# Patient Record
Sex: Female | Born: 1955 | Race: White | Marital: Married | Smoking: Never smoker
Health system: Southern US, Community
[De-identification: ages and names within clinical notes are randomized; demographics above are authoritative.]

## PROBLEM LIST (undated history)

## (undated) DIAGNOSIS — E7800 Pure hypercholesterolemia, unspecified: Secondary | ICD-10-CM

## (undated) DIAGNOSIS — I10 Essential (primary) hypertension: Secondary | ICD-10-CM

## (undated) DIAGNOSIS — M1990 Unspecified osteoarthritis, unspecified site: Secondary | ICD-10-CM

## (undated) DIAGNOSIS — E119 Type 2 diabetes mellitus without complications: Secondary | ICD-10-CM

## (undated) HISTORY — DX: Pure hypercholesterolemia, unspecified: E78.00

## (undated) HISTORY — DX: Essential (primary) hypertension: I10

## (undated) HISTORY — DX: Unspecified osteoarthritis, unspecified site: M19.90

## (undated) HISTORY — DX: Type 2 diabetes mellitus without complications: E11.9

## (undated) HISTORY — PX: HAND SURGERY: SHX662

---

## 2010-09-11 ENCOUNTER — Ambulatory Visit (HOSPITAL_COMMUNITY)
Admission: RE | Admit: 2010-09-11 | Discharge: 2010-09-11 | Disposition: A | Discharge status: Home / Self Care | Payer: BC Managed Care – PPO | Source: Ambulatory Visit | Attending: Rheumatology | Admitting: Rheumatology

## 2010-09-11 ENCOUNTER — Other Ambulatory Visit (HOSPITAL_COMMUNITY): Payer: Self-pay | Admitting: Rheumatology

## 2010-09-11 DIAGNOSIS — D869 Sarcoidosis, unspecified: Secondary | ICD-10-CM

## 2012-01-04 IMAGING — CR DG CHEST 2V
2 series · 2 of 2 positions shown · non-contrast
Comparison: None.

CLINICAL DATA: Sarcoidosis

CHEST - 2 VIEW

[w chest pa]
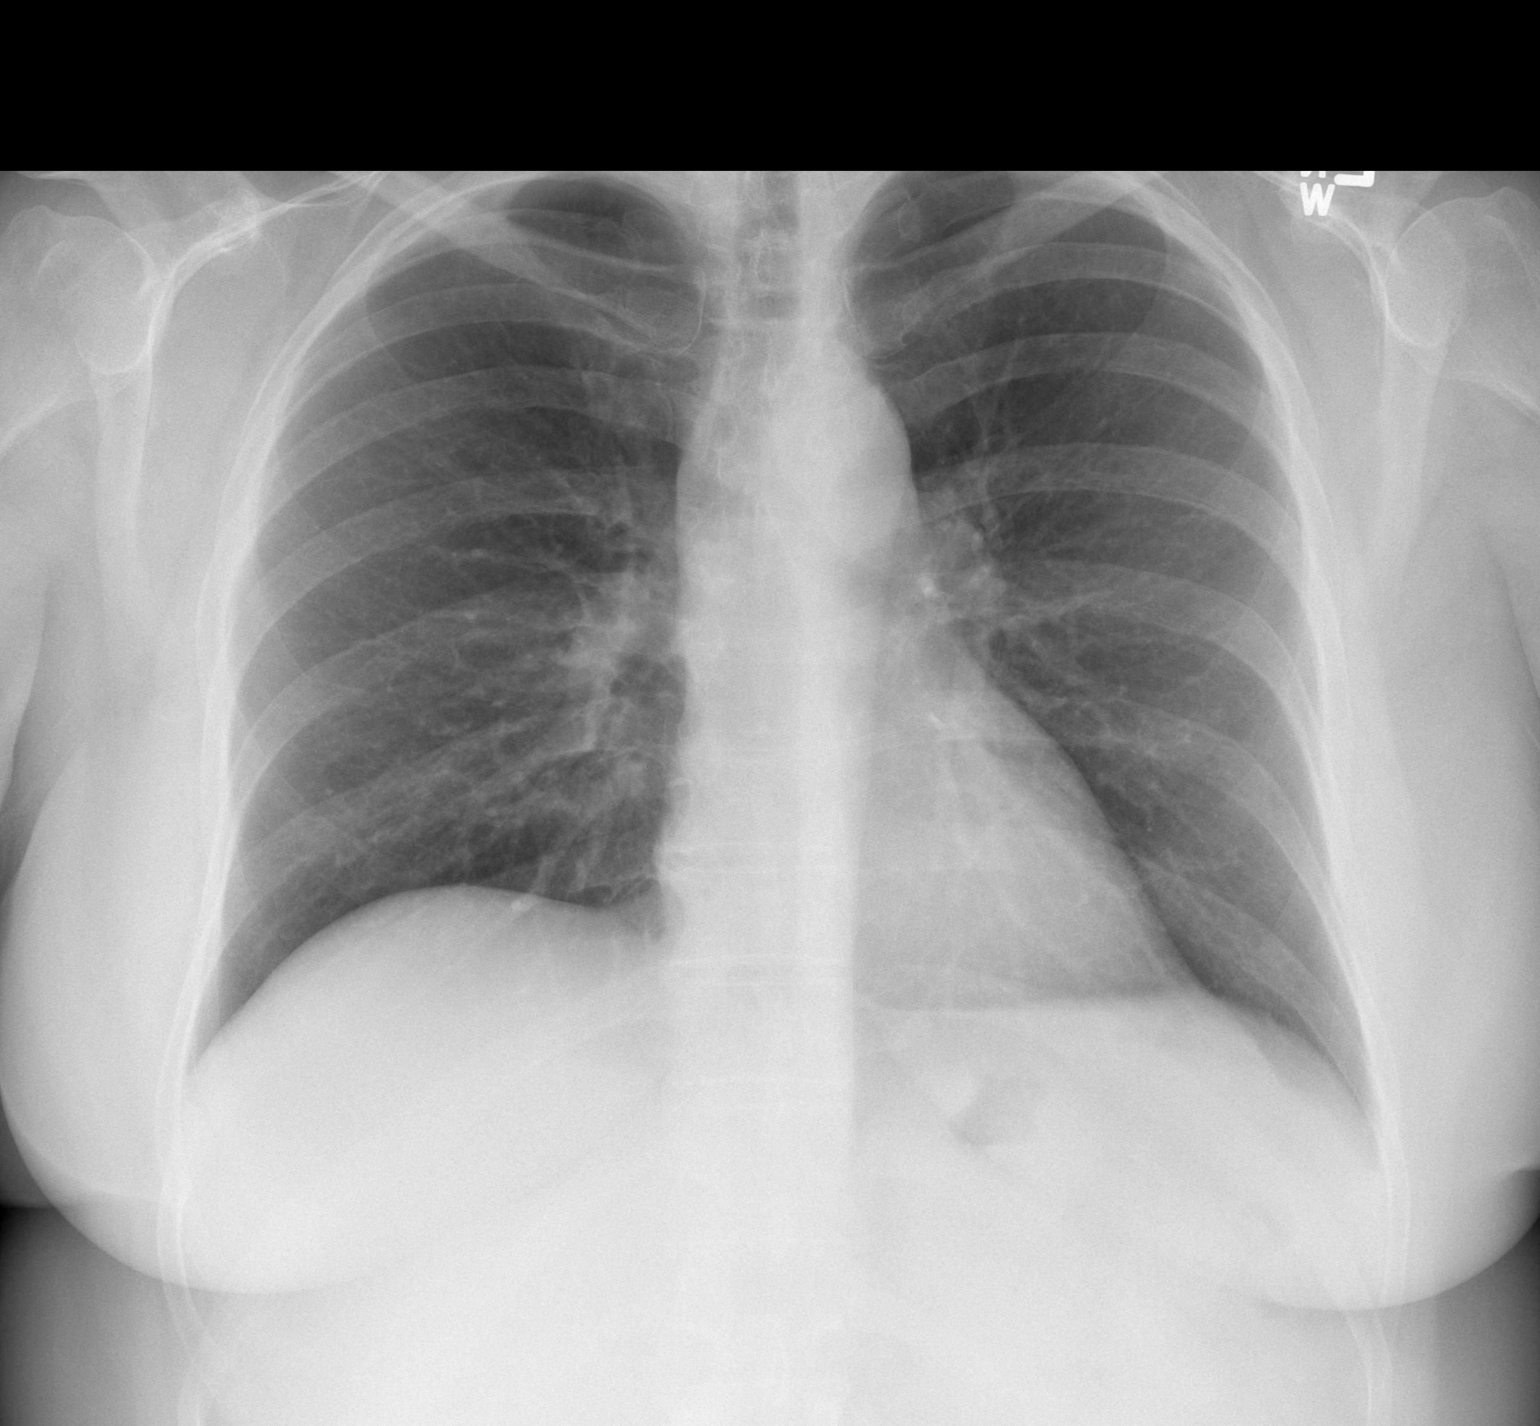

[w chest lat]
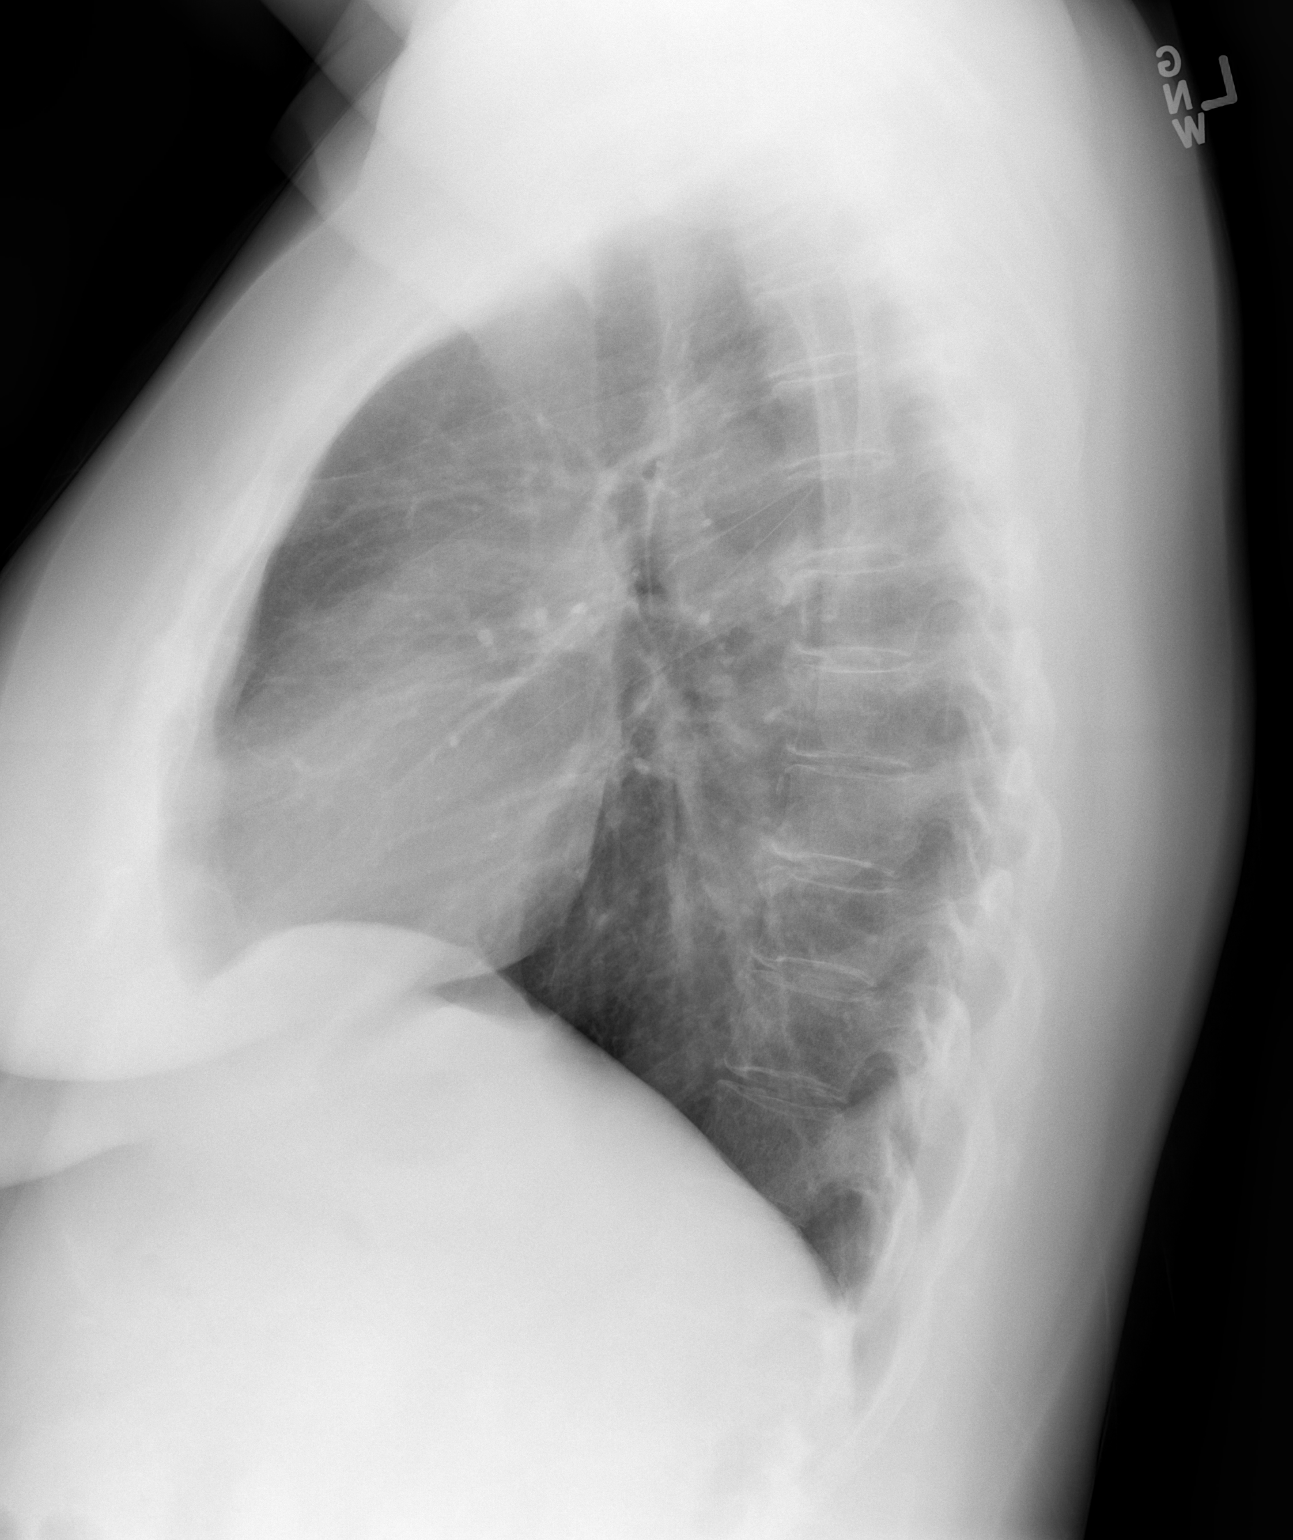

[2 of 2 positions shown; findings below may reference images not displayed]

FINDINGS: Heart is normal in size.  Lungs are clear.  No
pneumothorax and no pleural effusion.
IMPRESSION: No active cardiopulmonary disease.

## 2016-02-20 DIAGNOSIS — L4050 Arthropathic psoriasis, unspecified: Secondary | ICD-10-CM | POA: Insufficient documentation

## 2016-02-20 DIAGNOSIS — I10 Essential (primary) hypertension: Secondary | ICD-10-CM | POA: Insufficient documentation

## 2016-02-20 DIAGNOSIS — E7800 Pure hypercholesterolemia, unspecified: Secondary | ICD-10-CM | POA: Insufficient documentation

## 2016-02-20 DIAGNOSIS — K760 Fatty (change of) liver, not elsewhere classified: Secondary | ICD-10-CM | POA: Insufficient documentation

## 2016-02-20 DIAGNOSIS — E119 Type 2 diabetes mellitus without complications: Secondary | ICD-10-CM | POA: Insufficient documentation

## 2016-02-20 NOTE — Progress Notes (Addendum)
*IMAGE* Office Visit Note  Patient: Gail Hart             Date of Birth: 08/29/1955           MRN: [id number]             PCP: Ninetta LightsFURR,SARA, MD Referring: No ref. provider found Visit Date: 02/21/2016    Subjective:  Follow-up on psoriasis , Psoriatic arthritis, and high risk prescription.   History of Present Illness: Gail RuaJanet Roen is a 60 y.o. female  See above. Last seen in our office on 08/22/2015. Patient is currently on Humira every 6 weeks according to the last visit.   She also has a history of fatty liver disease. AST was 49 on January 2017. It was 73. A LT was 85 on 05/16/2015. It was 130.  Patient's AST is now 33 on 01/15/2016 labs A.l. T is 50 on the same labs. Patient has been following the next 56 day diet and successfully lost pounds between the last visit and this visit. And her liver numbers have also improved as a result of this diet.  Patient is complaining of the left knee has been bothering her for the last 2 months. She describes a burning sensation to the lateral aspect of her left knee. She does not describe stiffness. She is able to ambulate well otherwise. No swelling or warmth.  Approximately 5 years ago, patient injured her left knee while doing Zumba. She followed up with Dr. Debbe Odeaall Dorf and [facility]. An MRI proved that she had tear of her meniscus but it was not surgically repaired.  Activities of Daily Living:  Patient reports morning stiffness for 15 minutes.   Patient Denies nocturnal pain.  Difficulty dressing/grooming: Denies Difficulty climbing stairs: Denies Difficulty getting out of chair: Denies Difficulty using hands for taps, buttons, cutlery, and/or writing: Denies   Review of Systems  Constitutional: Negative for fatigue and weight loss (intentional loss w/ next 56 day diet. Was 204; now 181 lbs).  HENT: Negative for mouth sores and mouth dryness.   Eyes: Negative for dryness.  Respiratory: Negative for shortness  of breath.   Gastrointestinal: Negative for constipation and diarrhea.  Musculoskeletal: Negative for myalgias and myalgias.  Skin: Negative for sensitivity to sunlight.  Psychiatric/Behavioral: Negative for decreased concentration and sleep disturbance.    PMFS History:  Patient Active Problem List   Diagnosis Date Noted  . Psoriatic arthropathy (HCC) 02/20/2016  . Essential hypertension 02/20/2016  . Type 2 diabetes mellitus without complication, without long-term current use of insulin (HCC) 02/20/2016  . Hypercholesterolemia 02/20/2016  . Fatty liver disease, nonalcoholic 02/20/2016    Past Medical History:  Diagnosis Date  . Arthritis    PSORIATIC  . Diabetes mellitus without complication (HCC)   . High cholesterol   . Hypertension     Family History  Problem Relation Age of Onset  . Heart disease Mother   . Hypertension Mother   . High Cholesterol Mother   . Hypertension Father   . High Cholesterol Father    Past Surgical History:  Procedure Laterality Date  . CESAREAN SECTION    . HAND SURGERY     Social History   Social History Narrative  . No narrative on file     Objective: Vital Signs: BP 139/82 (BP Location: Left Arm, Patient Position: Sitting, Cuff Size: Large)   Pulse (!) 111   Resp 14   Ht 5\' 3"  (1.6 m)   Wt 181 lb (82.1 kg)  BMI 32.06 kg/m    Physical Exam  Constitutional: She is oriented to person, place, and time. She appears well-developed and well-nourished.  HENT:  Head: Normocephalic and atraumatic.  Eyes: EOM are normal. Pupils are equal, round, and reactive to light.  Cardiovascular: Normal rate, regular rhythm and normal heart sounds.  Exam reveals no gallop and no friction rub.   No murmur heard. Pulmonary/Chest: Effort normal and breath sounds normal. She has no wheezes. She has no rales.  Abdominal: Soft. Bowel sounds are normal. She exhibits no distension. There is no tenderness. There is no guarding. No hernia.   Musculoskeletal: Normal range of motion. She exhibits no edema, tenderness or deformity.  Lymphadenopathy:    She has no cervical adenopathy.  Neurological: She is alert and oriented to person, place, and time. Coordination normal.  Skin: Skin is warm and dry. Capillary refill takes less than 2 seconds. No rash noted.  Psychiatric: She has a normal mood and affect. Her behavior is normal.  Nursing note and vitals reviewed.    Musculoskeletal Exam:  Full range of motion of all joints  Grip strength is equal and strong bilaterally Fibromyalgia tender points are all absent  CDAI Exam: CDAI Homunculus Exam:   Joint Counts:  CDAI Tender Joint count: 0 CDAI Swollen Joint count: 0     Investigation: Findings:  Labs reviewed from 01/04/2016 CMP with GFR is normal except slightly low chloride at 96 Glucose at 101 which is close to normal limits Creatinine is slightly elevated at 1.20 which we will monitor AST is mildly elevated at 49 which we will monitor Calcium is 10.5 which is close to normal limits GFR is slightly low at 50 which we will monitor  CBC with differential is within normal limits    Imaging: Xr Hand 2 View Left  Result Date: 02/23/2016 Left hand shows severe narrowing at the DIP joints of second third fourth and fifth digit. No erosions There is joint space narrowing of the PIP joints of all digits All MCP joints shows mild narrowing. Intracarpal joints spacing are well preserved. Mild radiocarpal joint space narrowing. No erosions noted to the left hand. The ulnar styloid is normal.  Xr Hand 2 View Right  Result Date: 02/23/2016 Right hand shows severe narrowing at the DIP joints of second third fourth and fifth digit. No erosions There is joint space narrowing of the PIP joints of all digits All MCP joints shows mild narrowing. Intracarpal joints spacing are well preserved. Mild radiocarpal joint space narrowing. No erosions noted to the left hand. The ulnar  styloid is normal.   Xr Knee 3 View Left  Result Date: 02/23/2016 Severe-moderate medial compartment narrowing And moderate lateral compartment narrowing No CPPD. Mild patellofemoral joint space narrowing. Imaging consistent with moderate osteoarthritis of the knee  Xr Knee 3 View Right  Result Date: 02/23/2016 Moderate medial compartment narrowing of the lateral and medial joint space No CPPD. Mild patellofemoral joint space narrowing. Imaging consistent with moderate osteoarthritis of the knee   Speciality Comments: No specialty comments available.    Procedures:  No procedures performed Allergies: Atorvastatin; Moxifloxacin; Other; Pravastatin; and Rosuvastatin   Assessment / Plan: Visit Diagnoses: Psoriatic arthropathy (HCC) - Plan: XR KNEE 3 VIEW LEFT, XR KNEE 3 VIEW RIGHT, XR Hand 2 View Right, XR Hand 2 View Left  Other psoriasis  Essential hypertension  Type 2 diabetes mellitus without complication, without long-term current use of insulin (HCC)  Hypercholesterolemia  Fatty liver disease, nonalcoholic  Acute  pain of left knee - Plan: Uric acid, CANCELED: Uric acid  High risk medication use - Plan: Comprehensive metabolic panel, COMPLETE METABOLIC PANEL WITH GFR, CBC with Differential/Platelet, Quantiferon tb gold assay (blood), CANCELED: CBC with Differential/Platelet   No problem-specific Assessment & Plan notes found for this encounter.  Overall, patient is doing really well with her psoriatic arthritis. At next visit, we may consider stopping her Humira.  Until then, we will continue current treatment plan.  Follow-Up Instructions: Return in about 5 months (around 07/21/2016) for PsA, left knee pain, adjust HUMIRA (CONSIDER stopping if in remission).  Orders: Orders Placed This Encounter  Procedures  . XR KNEE 3 VIEW LEFT  . XR KNEE 3 VIEW RIGHT  . XR Hand 2 View Right  . XR Hand 2 View Left  . Comprehensive metabolic panel  . COMPLETE METABOLIC PANEL  WITH GFR  . CBC with Differential/Platelet  . Uric acid  . Quantiferon tb gold assay (blood)   Meds ordered this encounter  Medications  . HUMIRA PEN 40 MG/0.8ML PNKT    Refill:  1  . Cholecalciferol (VITAMIN D3) 5000 units TABS    Sig: Take by mouth.  . ezetimibe (ZETIA) 10 MG tablet    Refill:  2  . hydrochlorothiazide (HYDRODIURIL) 25 MG tablet    Refill:  2  . losartan (COZAAR) 100 MG tablet    Sig: Take 100 mg by mouth.  . metFORMIN (GLUCOPHAGE) 500 MG tablet    Sig: Take 500 mg by mouth.  . potassium chloride (K-DUR) 10 MEQ tablet    Refill:  2  . verapamil (VERELAN PM) 360 MG 24 hr capsule    Sig: Take 360 mg by mouth.  . Multiple Vitamin (MULTIVITAMIN WITH MINERALS) TABS tablet    Sig: Take 1 tablet by mouth daily.  Marland Kitchen KRILL OIL PO    Sig: Take 1 capsule by mouth daily.  . Adalimumab (HUMIRA PEN) 40 MG/0.8ML PNKT    Sig: Inject 1 pen into the skin every 7 (seven) weeks.    Dispense:  1 each    Refill:  0    Order Specific Question:   Lot Number?    Answer:   [id number]    Order Specific Question:   Expiration Date?    Answer:   06/27/2017    Order Specific Question:   Quantity    Answer:   1

## 2016-02-21 ENCOUNTER — Ambulatory Visit (INDEPENDENT_AMBULATORY_CARE_PROVIDER_SITE_OTHER): Payer: Self-pay

## 2016-02-21 ENCOUNTER — Ambulatory Visit (INDEPENDENT_AMBULATORY_CARE_PROVIDER_SITE_OTHER): Payer: BLUE CROSS/BLUE SHIELD | Admitting: Rheumatology

## 2016-02-21 ENCOUNTER — Encounter: Payer: Self-pay | Admitting: Rheumatology

## 2016-02-21 VITALS — BP 139/82 | HR 111 | Resp 14 | Ht 63.0 in | Wt 181.0 lb

## 2016-02-21 DIAGNOSIS — K760 Fatty (change of) liver, not elsewhere classified: Secondary | ICD-10-CM | POA: Diagnosis not present

## 2016-02-21 DIAGNOSIS — M25561 Pain in right knee: Secondary | ICD-10-CM

## 2016-02-21 DIAGNOSIS — M25562 Pain in left knee: Secondary | ICD-10-CM | POA: Diagnosis not present

## 2016-02-21 DIAGNOSIS — L408 Other psoriasis: Secondary | ICD-10-CM | POA: Diagnosis not present

## 2016-02-21 DIAGNOSIS — E119 Type 2 diabetes mellitus without complications: Secondary | ICD-10-CM | POA: Diagnosis not present

## 2016-02-21 DIAGNOSIS — E7800 Pure hypercholesterolemia, unspecified: Secondary | ICD-10-CM

## 2016-02-21 DIAGNOSIS — I10 Essential (primary) hypertension: Secondary | ICD-10-CM

## 2016-02-21 DIAGNOSIS — L4050 Arthropathic psoriasis, unspecified: Secondary | ICD-10-CM

## 2016-02-21 DIAGNOSIS — Z79899 Other long term (current) drug therapy: Secondary | ICD-10-CM | POA: Diagnosis not present

## 2016-02-21 MED ORDER — ADALIMUMAB 40 MG/0.8ML ~~LOC~~ AJKT: 1.0000 "pen " | AUTO-INJECTOR | SUBCUTANEOUS | 0 refills | Status: DC

## 2016-02-23 ENCOUNTER — Encounter: Payer: Self-pay | Admitting: Rheumatology

## 2016-03-27 ENCOUNTER — Telehealth: Payer: Self-pay | Admitting: Rheumatology

## 2016-03-27 NOTE — Telephone Encounter (Signed)
Called patient to advise her of xray results. Left message for her to call back for these and to find out what she needs for the lab

## 2016-03-27 NOTE — Telephone Encounter (Signed)
Patient states she was told at her last appointment in October that someone would call her about the xrays that were done of her knee and she has not heard anything yet. Patient says she was also told that a copy of lab orders would be mailed to her house. Please advise.

## 2016-03-29 ENCOUNTER — Telehealth (INDEPENDENT_AMBULATORY_CARE_PROVIDER_SITE_OTHER): Payer: Self-pay | Admitting: Physical Medicine and Rehabilitation

## 2016-03-29 NOTE — Telephone Encounter (Signed)
Patient returning your phone call from 11/29. Please give pt a call.

## 2016-04-02 NOTE — Telephone Encounter (Signed)
X-rays of bilateral hands shows no erosions from October 2017 Shows moderate to severe osteoarthritis of the handsX-ray of bilateral knee shows moderate osteoarthritis of the knees.

## 2016-04-02 NOTE — Telephone Encounter (Signed)
Patient has returned call to the office. She is requesting her lab order be faxed to [facility]. Patietn would also like her X-rays discussed with her. Can you review the x-rays with me so I can call the patient to review with her please.

## 2016-04-02 NOTE — Telephone Encounter (Signed)
Patient advised of her x-ray results and verbalized understanding.

## 2016-04-08 ENCOUNTER — Other Ambulatory Visit: Payer: Self-pay | Admitting: *Deleted

## 2016-04-08 DIAGNOSIS — Z79899 Other long term (current) drug therapy: Secondary | ICD-10-CM

## 2016-04-10 ENCOUNTER — Telehealth: Payer: Self-pay | Admitting: Rheumatology

## 2016-04-10 NOTE — Telephone Encounter (Signed)
Lab Orders faxed to patient.  

## 2016-04-10 NOTE — Telephone Encounter (Signed)
Please fax lab orders to patient at [phone]. She called at the end of November for these labs and is upset that it is taking too long.

## 2016-04-18 NOTE — Telephone Encounter (Signed)
Gail Hart has faxed some lab orders for her.

## 2016-06-19 DIAGNOSIS — Z8719 Personal history of other diseases of the digestive system: Secondary | ICD-10-CM | POA: Insufficient documentation

## 2016-06-19 DIAGNOSIS — Z79899 Other long term (current) drug therapy: Secondary | ICD-10-CM | POA: Insufficient documentation

## 2016-06-19 DIAGNOSIS — L409 Psoriasis, unspecified: Secondary | ICD-10-CM | POA: Insufficient documentation

## 2016-06-19 NOTE — Progress Notes (Signed)
Office Visit Note  Patient: Gail Hart             Date of Birth: [birth year]           MRN: [id number]             PCP: Adline Mango, MD Referring: Karleen Hampshire., MD Visit Date: 07/02/2016 Occupation: _0 @    Subjective:  Follow up   History of Present Illness: Gail Hart is a 61 y.o. female with history of psoriatic arthritis. According to patient she's been doing very well for the last few years. She hasn't had a flare in multiple years. Her Humira injections had been every 7 weeks and she does not feel any discomfort prior to her next injection. She wants to discontinue Humira at this point. She has never had psoriasis  Activities of Daily Living:  Patient reports morning stiffness for 0 minute.   Patient Denies nocturnal pain.  Difficulty dressing/grooming: Denies Difficulty climbing stairs: Denies Difficulty getting out of chair: Denies Difficulty using hands for taps, buttons, cutlery, and/or writing: Denies   Review of Systems  Constitutional: Positive for fatigue. Negative for night sweats, weight gain, weight loss and weakness.       Related to long work hours  HENT: Negative for mouth sores, trouble swallowing, trouble swallowing, mouth dryness and nose dryness.   Eyes: Negative for pain, redness, visual disturbance and dryness.  Respiratory: Negative for cough, shortness of breath and difficulty breathing.   Cardiovascular: Negative for chest pain, palpitations, hypertension, irregular heartbeat and swelling in legs/feet.  Gastrointestinal: Negative for blood in stool, constipation and diarrhea.  Endocrine: Negative for increased urination.  Genitourinary: Negative for vaginal dryness.  Musculoskeletal: Negative for arthralgias, joint pain, joint swelling, myalgias, muscle weakness, morning stiffness, muscle tenderness and myalgias.  Skin: Negative for color change, rash, hair loss, skin tightness, ulcers and sensitivity to sunlight.    Allergic/Immunologic: Negative for susceptible to infections.  Neurological: Negative for dizziness, memory loss and night sweats.  Hematological: Negative for swollen glands.  Psychiatric/Behavioral: Negative for depressed mood and sleep disturbance. The patient is not nervous/anxious.     PMFS History:  Patient Active Problem List   Diagnosis Date Noted  . Primary osteoarthritis of both hands 06/28/2016  . History of hypertension 06/28/2016  . History of diabetes mellitus 06/28/2016  . Psoriasis 06/19/2016  . High risk medication use 06/19/2016  . History of fatty infiltration of liver 06/19/2016  . Psoriatic arthropathy ([place]) 02/20/2016  . Essential hypertension 02/20/2016  . Type 2 diabetes mellitus without complication, without long-term current use of insulin ([place]) 02/20/2016  . Hypercholesterolemia 02/20/2016  . Fatty liver disease, nonalcoholic 29/52/8413    Past Medical History:  Diagnosis Date  . Arthritis    PSORIATIC  . Diabetes mellitus without complication ([place])   . High cholesterol   . Hypertension     Family History  Problem Relation Age of Onset  . Heart disease Mother   . Hypertension Mother   . High Cholesterol Mother   . Hypertension Father   . High Cholesterol Father    Past Surgical History:  Procedure Laterality Date  . CESAREAN SECTION    . HAND SURGERY     Social History   Social History Narrative  . No narrative on file     Objective: Vital Signs: BP 128/76   Pulse 82   Resp 16   Ht _1  (1.6 m)   Wt 188 lb (85.3 kg)   BMI 33.30 kg/m  Physical Exam  Constitutional: She is oriented to person, place, and time. She appears well-developed and well-nourished.  HENT:  Head: Normocephalic and atraumatic.  Eyes: Conjunctivae and EOM are normal.  Neck: Normal range of motion.  Cardiovascular: Normal rate, regular rhythm, normal heart sounds and intact distal pulses.   Pulmonary/Chest: Effort normal and breath sounds normal.   Abdominal: Soft. Bowel sounds are normal.  Lymphadenopathy:    She has no cervical adenopathy.  Neurological: She is alert and oriented to person, place, and time.  Skin: Skin is warm and dry. Capillary refill takes less than 2 seconds.  Psychiatric: She has a normal mood and affect. Her behavior is normal.  Nursing note and vitals reviewed.    Musculoskeletal Exam: C-spine and thoracic lumbar spine good range of motion. Shoulder joints elbow joints wrist joint MCPs PIPs DIPs with good range of motion with no synovitis hip joints knee joints ankles MTPs PIPs DIPs with good range of motion with no synovitis. No SI joint tenderness was noted.  CDAI Exam: No CDAI exam completed.    Investigation: Findings:  Her labs from 08-2010, CBC, ESR, hepatitis panel, uric acid, ACE level, RF, anti-CCP, HLA-B27, ANA were negative.    09/11/2010 -ray of her hands last visit and the right hand on May 3 showed erosive changes in the right 4th and 5th DIP joint and also ankylosis.  She does have some underlying OA also, but the findings of erosive changes were consistent with psoriatic arthritis.  I also reviewed her left hand film from 02-2010 which also showed erosive changes in her left fourth 4th DIP joint and also in the 5th DIP joint with some ankylosis.  C-spine is also consistent with psoriatic arthritis.    02/01/2015 hand xrays ordered by Mr Carlyon Shadow, report not available for review  Labs from May 16, 2015, show CMP with GFR is normal except for elevated AST at 49 and elevated ALT at 85.  TB Gold is negative.      Imaging: No results found.  Speciality Comments: No specialty comments available.    Procedures:  No procedures performed Allergies: Atorvastatin; Influenza vac split quad; Moxifloxacin; Other; Pravastatin; and Rosuvastatin   Assessment / Plan:     Visit Diagnoses: Psoriatic arthropathy ([place]) patient is in remission. She has not had any flare for a long time. She's been  taking Humira every 7 weeks. We had detailed discussion regarding this today have advised her to discontinue Humira and monitor closely if she develops any skin symptoms she supposed to notify me. If she has recurrence of symptoms we may have to restart medication.  High risk medication use - Humira every 7 weeks currently.  History of fatty infiltration of liver: According to patient her LFTs improved after changing her diet.  Primary osteoarthritis of both hands: She has some stiffness and discomfort.  History of hypertension  History of diabetes mellitus    Orders: Orders Placed This Encounter  Procedures  . CBC with Differential/Platelet  . CMP14+EGFR   No orders of the defined types were placed in this encounter.   Face-to-face time spent with patient was 25 minutes. 50% of time was spent in counseling and coordination of care.  Follow-Up Instructions: Return in about 6 months (around 01/02/2017) for Psoriatic arthritis.   Bo Merino, MD  Note - This record has been created using Editor, commissioning.  Chart creation errors have been sought, but may not always  have been located. Such creation errors do not reflect  on  the standard of medical care.

## 2016-06-28 DIAGNOSIS — Z8639 Personal history of other endocrine, nutritional and metabolic disease: Secondary | ICD-10-CM | POA: Insufficient documentation

## 2016-06-28 DIAGNOSIS — Z8679 Personal history of other diseases of the circulatory system: Secondary | ICD-10-CM | POA: Insufficient documentation

## 2016-06-28 DIAGNOSIS — M19042 Primary osteoarthritis, left hand: Secondary | ICD-10-CM

## 2016-06-28 DIAGNOSIS — M19041 Primary osteoarthritis, right hand: Secondary | ICD-10-CM | POA: Insufficient documentation

## 2016-07-02 ENCOUNTER — Ambulatory Visit (INDEPENDENT_AMBULATORY_CARE_PROVIDER_SITE_OTHER): Payer: BLUE CROSS/BLUE SHIELD | Admitting: Rheumatology

## 2016-07-02 ENCOUNTER — Encounter: Payer: Self-pay | Admitting: Rheumatology

## 2016-07-02 VITALS — BP 128/76 | HR 82 | Resp 16 | Ht 63.0 in | Wt 188.0 lb

## 2016-07-02 DIAGNOSIS — Z8719 Personal history of other diseases of the digestive system: Secondary | ICD-10-CM | POA: Diagnosis not present

## 2016-07-02 DIAGNOSIS — M19042 Primary osteoarthritis, left hand: Secondary | ICD-10-CM | POA: Diagnosis not present

## 2016-07-02 DIAGNOSIS — Z8679 Personal history of other diseases of the circulatory system: Secondary | ICD-10-CM

## 2016-07-02 DIAGNOSIS — L4050 Arthropathic psoriasis, unspecified: Secondary | ICD-10-CM | POA: Diagnosis not present

## 2016-07-02 DIAGNOSIS — M19041 Primary osteoarthritis, right hand: Secondary | ICD-10-CM

## 2016-07-02 DIAGNOSIS — Z79899 Other long term (current) drug therapy: Secondary | ICD-10-CM

## 2016-07-05 ENCOUNTER — Other Ambulatory Visit: Payer: Self-pay | Admitting: Rheumatology

## 2016-07-05 NOTE — Telephone Encounter (Signed)
Last Visit: 07/02/16 Next Visit: 01/02/17 Labs: May 16, 2015, show CMP with GFR is normal except for elevated AST at 49 and elevated ALT at 85.  TB Gold is negative.  Okay to refill Humira?

## 2016-07-05 NOTE — Telephone Encounter (Signed)
ok 

## 2016-07-23 ENCOUNTER — Telehealth: Payer: Self-pay | Admitting: *Deleted

## 2016-07-23 NOTE — Telephone Encounter (Signed)
CBC/ CMP drawn on 07/03/16 WNL

## 2016-12-27 NOTE — Progress Notes (Signed)
Office Visit Note  Patient: Gail Hart             Date of Birth: [birth year]           MRN: [id number]             PCP: Karleen Hampshire., MD Referring: Karleen Hampshire., MD Visit Date: 01/02/2017 Occupation: '@GUAROCC'$ @    Subjective:  Medication Management (has not taken Humira no flares )   History of Present Illness: Gail Hart is a 61 y.o. female history of psoriatic arthritis and psoriasis. She's been off Humira since January 2018. She has not had any flares since then. She does not recall when her last psoriatic arthritis flare was. She denies any joint pain or discomfort.  She's never had any psoriasis.  Activities of Daily Living:  Patient reports morning stiffness for 0 minute.   Patient Denies nocturnal pain.  Difficulty dressing/grooming: Denies Difficulty climbing stairs: Denies Difficulty getting out of chair: Denies Difficulty using hands for taps, buttons, cutlery, and/or writing: Denies   Review of Systems  Constitutional: Negative for fatigue, night sweats, weight gain, weight loss and weakness.  HENT: Negative for mouth sores, trouble swallowing, trouble swallowing, mouth dryness and nose dryness.   Eyes: Negative for pain, redness, visual disturbance and dryness.  Respiratory: Negative for cough, shortness of breath and difficulty breathing.   Cardiovascular: Positive for hypertension. Negative for chest pain, palpitations, irregular heartbeat and swelling in legs/feet.  Gastrointestinal: Negative for blood in stool, constipation and diarrhea.  Endocrine: Negative for increased urination.  Genitourinary: Negative for vaginal dryness.  Musculoskeletal: Negative for arthralgias, joint pain, joint swelling, myalgias, muscle weakness, morning stiffness, muscle tenderness and myalgias.  Skin: Negative for color change, rash, hair loss, skin tightness, ulcers and sensitivity to sunlight.  Allergic/Immunologic: Negative for susceptible to infections.    Neurological: Negative for dizziness, memory loss and night sweats.  Hematological: Negative for swollen glands.  Psychiatric/Behavioral: Negative for depressed mood and sleep disturbance. The patient is not nervous/anxious.     PMFS History:  Patient Active Problem List   Diagnosis Date Noted  . Primary osteoarthritis of both hands 06/28/2016  . History of hypertension 06/28/2016  . History of diabetes mellitus 06/28/2016  . High risk medication use 06/19/2016  . History of fatty infiltration of liver 06/19/2016  . Psoriatic arthropathy ([place]) 02/20/2016  . Essential hypertension 02/20/2016  . Type 2 diabetes mellitus without complication, without long-term current use of insulin ([place]) 02/20/2016  . Hypercholesterolemia 02/20/2016  . Fatty liver disease, nonalcoholic 50/53/9767    Past Medical History:  Diagnosis Date  . Arthritis    PSORIATIC  . Diabetes mellitus without complication ([place])   . High cholesterol   . Hypertension     Family History  Problem Relation Age of Onset  . Heart disease Mother   . Hypertension Mother   . High Cholesterol Mother   . Hypertension Father   . High Cholesterol Father    Past Surgical History:  Procedure Laterality Date  . CESAREAN SECTION    . HAND SURGERY     Social History   Social History Narrative  . No narrative on file     Objective: Vital Signs: BP 130/74   Pulse 74   Resp 16   Ht 5' 3.5" (1.613 m)   Wt 200 lb (90.7 kg)   BMI 34.87 kg/m    Physical Exam  Constitutional: She is oriented to person, place, and time. She appears well-developed and well-nourished.  HENT:  Head: Normocephalic and atraumatic.  Eyes: Conjunctivae and EOM are normal.  Neck: Normal range of motion.  Cardiovascular: Normal rate, regular rhythm, normal heart sounds and intact distal pulses.   Pulmonary/Chest: Effort normal and breath sounds normal.  Abdominal: Soft. Bowel sounds are normal.  Lymphadenopathy:    She has no cervical  adenopathy.  Neurological: She is alert and oriented to person, place, and time.  Skin: Skin is warm and dry. Capillary refill takes less than 2 seconds.  Psychiatric: She has a normal mood and affect. Her behavior is normal.  Nursing note and vitals reviewed.    Musculoskeletal Exam: C-spine and thoracic lumbar spine good range of motion no SI joint tenderness showed joints although joints wrist joint MCPs PIPs DIPs with good range of motion with no synovitis. Hip joints knee joints ankles MTPs PIPs DIPs with good range of motion with no synovitis.  CDAI Exam: CDAI Homunculus Exam:   Joint Counts:  CDAI Tender Joint count: 0 CDAI Swollen Joint count: 0  Global Assessments:  Patient Global Assessment: 0 Provider Global Assessment: 0    Investigation: Findings:  Her labs from 08-2010, CBC, ESR, hepatitis panel, uric acid, ACE level, RF, anti-CCP, HLA-B27, ANA were negative.    Jan 2017 negative TB gold   07/03/2016 normal CBC CMP       Imaging: No results found.  Speciality Comments: No specialty comments available.    Procedures:  No procedures performed Allergies: Atorvastatin; Influenza vac split quad; Moxifloxacin; Other; Pravastatin; and Rosuvastatin   Assessment / Plan:     Visit Diagnoses: Psoriatic arthropathy ([place]): She appears to be in remission with no active synovitis. She has had dactylitis in the past. As patient has been asymptomatic for a long time we decided to discontinue her Humira in January. Have advised to contact me in case she had a flare in the [place] for follow-up on when necessary basis.  High risk medication use -she has been off Humira for the last 8 months now. She has had no recurrence of arthritis.  Primary osteoarthritis of both hands: Not very symptomatic.  History of fatty infiltration of liver  History of hypertension: Blood pressure is mildly elevated.  History of diabetes mellitus  History of hyperlipidemia     Orders: No orders of the defined types were placed in this encounter.  No orders of the defined types were placed in this encounter.   .  Follow-Up Instructions: Return if symptoms worsen or fail to improve, for Psoriatic arthritis.   Bo Merino, MD  Note - This record has been created using Editor, commissioning.  Chart creation errors have been sought, but may not always  have been located. Such creation errors do not reflect on  the standard of medical care.

## 2017-01-02 ENCOUNTER — Encounter: Payer: Self-pay | Admitting: Rheumatology

## 2017-01-02 ENCOUNTER — Ambulatory Visit (INDEPENDENT_AMBULATORY_CARE_PROVIDER_SITE_OTHER): Payer: BLUE CROSS/BLUE SHIELD | Admitting: Rheumatology

## 2017-01-02 VITALS — BP 130/74 | HR 74 | Resp 16 | Ht 63.5 in | Wt 200.0 lb

## 2017-01-02 DIAGNOSIS — Z8679 Personal history of other diseases of the circulatory system: Secondary | ICD-10-CM | POA: Diagnosis not present

## 2017-01-02 DIAGNOSIS — Z79899 Other long term (current) drug therapy: Secondary | ICD-10-CM

## 2017-01-02 DIAGNOSIS — M19042 Primary osteoarthritis, left hand: Secondary | ICD-10-CM | POA: Diagnosis not present

## 2017-01-02 DIAGNOSIS — M19041 Primary osteoarthritis, right hand: Secondary | ICD-10-CM | POA: Diagnosis not present

## 2017-01-02 DIAGNOSIS — L4050 Arthropathic psoriasis, unspecified: Secondary | ICD-10-CM

## 2017-01-02 DIAGNOSIS — Z8719 Personal history of other diseases of the digestive system: Secondary | ICD-10-CM | POA: Diagnosis not present

## 2017-01-02 DIAGNOSIS — Z8639 Personal history of other endocrine, nutritional and metabolic disease: Secondary | ICD-10-CM | POA: Diagnosis not present

## 2019-09-30 NOTE — Progress Notes (Signed)
Office Visit Note  Patient: Gail Hart             Date of Birth: 1955/11/02           MRN: [id number]             PCP: Karleen Hampshire., MD Referring: Karleen Hampshire., MD Visit Date: 10/14/2019 Occupation: @GUAROCC @  Subjective:  Arthritis (Doing good)   History of Present Illness: Gail Hart is a 64 y.o. female with history of psoriatic arthritis and osteoarthritis.  She has been off Humira since 2018.  She denies any increased joint pain or joint swelling.  She states she continues to have some stiffness in her joints due to underlying osteoarthritis.  She has not noticed any rash from psoriasis.  Activities of Daily Living:  Patient reports morning stiffness for 3 hours.   Patient Denies nocturnal pain.  Difficulty dressing/grooming: Denies Difficulty climbing stairs: Denies Difficulty getting out of chair: Denies Difficulty using hands for taps, buttons, cutlery, and/or writing: Denies  Review of Systems  Constitutional: Negative for fatigue, night sweats, weight gain and weight loss.  HENT: Negative for mouth sores, trouble swallowing, trouble swallowing, mouth dryness and nose dryness.   Eyes: Positive for dryness. Negative for pain, redness and visual disturbance.  Respiratory: Negative for cough, shortness of breath and difficulty breathing.   Cardiovascular: Positive for swelling in legs/feet. Negative for chest pain, palpitations, hypertension and irregular heartbeat.  Gastrointestinal: Negative for blood in stool, constipation and diarrhea.  Endocrine: Negative for increased urination.  Genitourinary: Negative for difficulty urinating and vaginal dryness.  Musculoskeletal: Positive for morning stiffness. Negative for arthralgias, joint pain, joint swelling, myalgias, muscle weakness, muscle tenderness and myalgias.  Skin: Negative for color change, rash, hair loss, skin tightness, ulcers and sensitivity to sunlight.  Allergic/Immunologic: Negative for  susceptible to infections.  Neurological: Negative for dizziness, numbness, memory loss, night sweats and weakness.  Hematological: Negative for bruising/bleeding tendency and swollen glands.  Psychiatric/Behavioral: Negative for depressed mood and sleep disturbance. The patient is not nervous/anxious.     PMFS History:  Patient Active Problem List   Diagnosis Date Noted  . Primary osteoarthritis of both hands 06/28/2016  . History of hypertension 06/28/2016  . History of diabetes mellitus 06/28/2016  . High risk medication use 06/19/2016  . History of fatty infiltration of liver 06/19/2016  . Psoriatic arthropathy ([place]) 02/20/2016  . Essential hypertension 02/20/2016  . Type 2 diabetes mellitus without complication, without long-term current use of insulin ([place]) 02/20/2016  . Hypercholesterolemia 02/20/2016  . Fatty liver disease, nonalcoholic 99/24/2683    Past Medical History:  Diagnosis Date  . Arthritis    PSORIATIC  . Diabetes mellitus without complication ([place])   . High cholesterol   . Hypertension     Family History  Problem Relation Age of Onset  . Heart disease Mother   . Hypertension Mother   . High Cholesterol Mother   . Hypertension Father   . High Cholesterol Father    Past Surgical History:  Procedure Laterality Date  . CESAREAN SECTION    . HAND SURGERY     Social History   Social History Narrative  . Not on file    There is no immunization history on file for this patient.   Objective: Vital Signs: BP 122/61 (BP Location: Left Arm, Patient Position: Sitting, Cuff Size: Normal)   Pulse 87   Resp 16   Ht 5\' 3"  (1.6 m)   Wt 222 lb (100.7  kg)   BMI 39.33 kg/m    Physical Exam Vitals and nursing note reviewed.  Constitutional:      Appearance: She is well-developed.  HENT:     Head: Normocephalic and atraumatic.  Eyes:     Conjunctiva/sclera: Conjunctivae normal.  Cardiovascular:     Rate and Rhythm: Normal rate and regular rhythm.      Heart sounds: Normal heart sounds.  Pulmonary:     Effort: Pulmonary effort is normal.     Breath sounds: Normal breath sounds.  Abdominal:     General: Bowel sounds are normal.     Palpations: Abdomen is soft.  Musculoskeletal:     Cervical back: Normal range of motion.  Lymphadenopathy:     Cervical: No cervical adenopathy.  Skin:    General: Skin is warm and dry.     Capillary Refill: Capillary refill takes less than 2 seconds.  Neurological:     Mental Status: She is alert and oriented to person, place, and time.  Psychiatric:        Behavior: Behavior normal.      Musculoskeletal Exam: C-spine and thoracic spine were in good range of motion.  Shoulder joints, elbow joints, wrist joints with good range of motion.  She has bilateral PIP and DIP thickening but no synovitis was noted.  Hip joints, knee joints, ankles with good range of motion.  She had no MTP or PIP tenderness.  No synovitis was noted.  There was no evidence of Achilles tendinitis or plantar fasciitis.  CDAI Exam: CDAI Score: -- Patient Global: --; Provider Global: -- Swollen: --; Tender: -- Joint Exam 10/14/2019   No joint exam has been documented for this visit   There is currently no information documented on the homunculus. Go to the Rheumatology activity and complete the homunculus joint exam.  Investigation: No additional findings.  Imaging: No results found.  Recent Labs: No results found for: WBC, HGB, PLT, NA, K, CL, CO2, GLUCOSE, BUN, CREATININE, BILITOT, ALKPHOS, AST, ALT, PROT, ALBUMIN, CALCIUM, GFRAA, QFTBGOLD, QFTBGOLDPLUS  Speciality Comments: No specialty comments available.  Procedures:  No procedures performed Allergies: Atorvastatin, Influenza virus vaccine split, Moxifloxacin, Other, Pravastatin, and Rosuvastatin   Assessment / Plan:     Visit Diagnoses: Psoriatic arthropathy ([facility]) -disease has been in remission.  She has been off Humira since 2018.  Her last office visit was in  2018.  High risk medication use - D/c Humira January 2018  Primary osteoarthritis of both hands - Plan: XR Hand 2 View Right, XR Hand 2 View Left.  X-ray findings were consistent with osteoarthritis and psoriatic arthritis overlap.  Findings were discussed with patient.  Joint protection muscle strengthening was discussed.  Foot pain, bilateral - Plan: XR Foot 2 Views Right, XR Foot 2 Views Left.  X-ray findings are consistent with osteoarthritis.  X-rays were reviewed with the patient.  History of fatty infiltration of liver  History of hypertension  History of diabetes mellitus  History of hyperlipidemia    Orders: Orders Placed This Encounter  Procedures  . XR Hand 2 View Right  . XR Hand 2 View Left  . XR Foot 2 Views Right  . XR Foot 2 Views Left   No orders of the defined types were placed in this encounter.  .  Follow-Up Instructions: Return in about 1 year (around 10/13/2020) for Psoriatic arthritis, Osteoarthritis.   Pollyann Savoy, MD  Note - This record has been created using Animal nutritionist.  Chart creation errors  have been sought, but may not always  have been located. Such creation errors do not reflect on  the standard of medical care.

## 2019-10-12 ENCOUNTER — Ambulatory Visit: Payer: Self-pay | Admitting: Rheumatology

## 2019-10-14 ENCOUNTER — Ambulatory Visit: Payer: Self-pay

## 2019-10-14 ENCOUNTER — Encounter: Payer: Self-pay | Admitting: Rheumatology

## 2019-10-14 ENCOUNTER — Other Ambulatory Visit: Payer: Self-pay

## 2019-10-14 ENCOUNTER — Ambulatory Visit: Payer: BC Managed Care – PPO | Admitting: Rheumatology

## 2019-10-14 VITALS — BP 122/61 | HR 87 | Resp 16 | Ht 63.0 in | Wt 222.0 lb

## 2019-10-14 DIAGNOSIS — M19041 Primary osteoarthritis, right hand: Secondary | ICD-10-CM

## 2019-10-14 DIAGNOSIS — Z8719 Personal history of other diseases of the digestive system: Secondary | ICD-10-CM | POA: Diagnosis not present

## 2019-10-14 DIAGNOSIS — M79672 Pain in left foot: Secondary | ICD-10-CM | POA: Diagnosis not present

## 2019-10-14 DIAGNOSIS — M79671 Pain in right foot: Secondary | ICD-10-CM

## 2019-10-14 DIAGNOSIS — Z8639 Personal history of other endocrine, nutritional and metabolic disease: Secondary | ICD-10-CM

## 2019-10-14 DIAGNOSIS — Z79899 Other long term (current) drug therapy: Secondary | ICD-10-CM

## 2019-10-14 DIAGNOSIS — L4050 Arthropathic psoriasis, unspecified: Secondary | ICD-10-CM

## 2019-10-14 DIAGNOSIS — M19042 Primary osteoarthritis, left hand: Secondary | ICD-10-CM

## 2019-10-14 DIAGNOSIS — Z8679 Personal history of other diseases of the circulatory system: Secondary | ICD-10-CM

## 2019-10-14 NOTE — Addendum Note (Signed)
Addended by: Pollyann Savoy on: 10/14/2019 05:32 PM   Modules accepted: Orders
